# Patient Record
Sex: Male | Born: 2000 | Race: White | Hispanic: No | Marital: Single | Smoking: Never smoker
Health system: Southern US, Community
[De-identification: ages and names within clinical notes are randomized; demographics above are authoritative.]

## PROBLEM LIST (undated history)

## (undated) DIAGNOSIS — T7840XA Allergy, unspecified, initial encounter: Secondary | ICD-10-CM

## (undated) DIAGNOSIS — Z789 Other specified health status: Secondary | ICD-10-CM

## (undated) HISTORY — DX: Allergy, unspecified, initial encounter: T78.40XA

## (undated) HISTORY — DX: Other specified health status: Z78.9

---
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
## 2023-09-17 ENCOUNTER — Ambulatory Visit: Payer: Self-pay | Admitting: Family Medicine

## 2023-10-28 ENCOUNTER — Encounter (HOSPITAL_BASED_OUTPATIENT_CLINIC_OR_DEPARTMENT_OTHER): Payer: Self-pay | Admitting: *Deleted

## 2023-10-28 ENCOUNTER — Other Ambulatory Visit (HOSPITAL_BASED_OUTPATIENT_CLINIC_OR_DEPARTMENT_OTHER): Payer: Self-pay

## 2023-10-28 DIAGNOSIS — R053 Chronic cough: Secondary | ICD-10-CM

## 2023-11-12 ENCOUNTER — Ambulatory Visit (HOSPITAL_BASED_OUTPATIENT_CLINIC_OR_DEPARTMENT_OTHER)
Admission: RE | Admit: 2023-11-12 | Discharge: 2023-11-12 | Disposition: A | Discharge status: Home / Self Care | Source: Ambulatory Visit

## 2023-11-12 DIAGNOSIS — R053 Chronic cough: Secondary | ICD-10-CM | POA: Diagnosis present
